# Patient Record
Sex: Female | Born: 1995 | Hispanic: No | Marital: Single | Smoking: Never smoker
Health system: Southern US, Community
[De-identification: ages and names within clinical notes are randomized; demographics above are authoritative.]

---
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
## 2017-07-01 ENCOUNTER — Ambulatory Visit: Payer: Self-pay | Admitting: Physician Assistant

## 2017-08-06 ENCOUNTER — Emergency Department (HOSPITAL_COMMUNITY): Payer: Medicaid Other

## 2017-08-06 ENCOUNTER — Encounter (HOSPITAL_COMMUNITY): Payer: Self-pay | Admitting: *Deleted

## 2017-08-06 ENCOUNTER — Emergency Department (HOSPITAL_COMMUNITY)
Admission: EM | Admit: 2017-08-06 | Discharge: 2017-08-07 | Disposition: A | Discharge status: Home / Self Care | Payer: Medicaid Other | Attending: Emergency Medicine | Admitting: Emergency Medicine

## 2017-08-06 DIAGNOSIS — N3000 Acute cystitis without hematuria: Secondary | ICD-10-CM | POA: Diagnosis not present

## 2017-08-06 DIAGNOSIS — N760 Acute vaginitis: Secondary | ICD-10-CM | POA: Insufficient documentation

## 2017-08-06 DIAGNOSIS — R1031 Right lower quadrant pain: Secondary | ICD-10-CM

## 2017-08-06 DIAGNOSIS — R102 Pelvic and perineal pain: Secondary | ICD-10-CM

## 2017-08-06 DIAGNOSIS — B9689 Other specified bacterial agents as the cause of diseases classified elsewhere: Secondary | ICD-10-CM

## 2017-08-06 LAB — CBC
HCT: 34.2 % — ABNORMAL LOW (ref 36.0–46.0)
HEMOGLOBIN: 11.9 g/dL — AB (ref 12.0–15.0)
MCH: 31.1 pg (ref 26.0–34.0)
MCHC: 34.8 g/dL (ref 30.0–36.0)
MCV: 89.3 fL (ref 78.0–100.0)
PLATELETS: 307 10*3/uL (ref 150–400)
RBC: 3.83 MIL/uL — ABNORMAL LOW (ref 3.87–5.11)
RDW: 12.2 % (ref 11.5–15.5)
WBC: 9.9 10*3/uL (ref 4.0–10.5)

## 2017-08-06 LAB — WET PREP, GENITAL
SPERM: NONE SEEN
Trich, Wet Prep: NONE SEEN
Yeast Wet Prep HPF POC: NONE SEEN

## 2017-08-06 LAB — COMPREHENSIVE METABOLIC PANEL
ALBUMIN: 4.4 g/dL (ref 3.5–5.0)
ALT: 11 U/L — ABNORMAL LOW (ref 14–54)
ANION GAP: 8 (ref 5–15)
AST: 20 U/L (ref 15–41)
Alkaline Phosphatase: 47 U/L (ref 38–126)
BILIRUBIN TOTAL: 0.4 mg/dL (ref 0.3–1.2)
BUN: 9 mg/dL (ref 6–20)
CO2: 24 mmol/L (ref 22–32)
Calcium: 9 mg/dL (ref 8.9–10.3)
Chloride: 105 mmol/L (ref 101–111)
Creatinine, Ser: 0.85 mg/dL (ref 0.44–1.00)
GFR calc Af Amer: >60 mL/min (ref >60)
GFR calc non Af Amer: >60 mL/min (ref >60)
Glucose, Bld: 98 mg/dL (ref 65–99)
Potassium: 3.6 mmol/L (ref 3.5–5.1)
Sodium: 137 mmol/L (ref 135–145)
TOTAL PROTEIN: 7 g/dL (ref 6.5–8.1)

## 2017-08-06 LAB — URINALYSIS, ROUTINE W REFLEX MICROSCOPIC
BILIRUBIN URINE: NEGATIVE
Bacteria, UA: NONE SEEN
Glucose, UA: NEGATIVE mg/dL
Ketones, ur: NEGATIVE mg/dL
NITRITE: NEGATIVE
PH: 7 (ref 5.0–8.0)
Protein, ur: NEGATIVE mg/dL
SPECIFIC GRAVITY, URINE: 1.008 (ref 1.005–1.030)

## 2017-08-06 LAB — LIPASE, BLOOD: Lipase: 38 U/L (ref 11–51)

## 2017-08-06 LAB — I-STAT BETA HCG BLOOD, ED (MC, WL, AP ONLY)

## 2017-08-06 MED ORDER — MORPHINE SULFATE (PF) 4 MG/ML IV SOLN
4.0000 mg | Freq: Once | INTRAVENOUS | Status: AC
  Administered 2017-08-06: 4 mg via INTRAVENOUS
  Filled 2017-08-06: qty 1

## 2017-08-06 MED ORDER — ONDANSETRON HCL 4 MG/2ML IJ SOLN
4.0000 mg | Freq: Once | INTRAMUSCULAR | Status: AC
  Administered 2017-08-06: 4 mg via INTRAVENOUS
  Filled 2017-08-06: qty 2

## 2017-08-06 MED ORDER — SODIUM CHLORIDE 0.9 % IV BOLUS (SEPSIS)
1000.0000 mL | Freq: Once | INTRAVENOUS | Status: AC
  Administered 2017-08-06: 1000 mL via INTRAVENOUS

## 2017-08-06 MED ORDER — IOPAMIDOL (ISOVUE-300) INJECTION 61%
INTRAVENOUS | Status: AC
  Filled 2017-08-06: qty 30

## 2017-08-06 NOTE — ED Triage Notes (Signed)
Pt c/o RLQ abd pain onset x 4 days, pt denies n/v, pt reports  Constipation with symptom onset & then x 2 loose stools today, pt denies urinary syptoms, A&O x4

## 2017-08-06 NOTE — ED Triage Notes (Signed)
Pt c/o R lower quadrant pain onset yesterday, pt denies n/v/d, pt reports constipation x 3-4 days ago with loose stools twice today, pt denies n/v/d

## 2017-08-06 NOTE — ED Provider Notes (Signed)
[facility]-EMERGENCY DEPT Provider Note   CSN: [id number] Arrival date & time: 08/06/17  1616     History   Chief Complaint Chief Complaint  Patient presents with  . Abdominal Pain    HPI   Blood pressure 110/65, pulse 73, temperature 98.1 F (36.7 C), temperature source Oral, resp. rate 16, height 5\' 2"  (1.575 m), weight 52.2 kg (115 lb), last menstrual period 08/03/2017, SpO2 100 %.  Michele Irwin is a 21 y.o. female complaining of vague abdominal pain onset 4 days ago localizing to the right lower quadrant with associated anorexia, single episode of nonbloody, nonbilious, non-coffee-ground emesis 2 days ago. She denies fevers, chills, dysuria, hematuria, abnormal vaginal discharge, history of ovarian cysts. She does follow regularly with OB/GYN. She just completed her period 2 days ago however, this is atypical for her menstrual pain and cramping.She states she is not worried about sexual transmitted infections.  History reviewed. No pertinent past medical history.  There are no active problems to display for this patient.   History reviewed. No pertinent surgical history.  OB History    No data available       Home Medications    Prior to Admission medications   Medication Sig Start Date End Date Taking? Authorizing Provider  cephALEXin (KEFLEX) 500 MG capsule Take 1 capsule (500 mg total) by mouth 2 (two) times daily. 08/07/17   Victoria Henshaw, Joni Reining, PA-C  metroNIDAZOLE (FLAGYL) 500 MG tablet Take 1 tablet (500 mg total) by mouth 3 (three) times daily. 08/07/17 08/14/17  Marijayne Rauth, Mardella Layman    Family History No family history on file.  Social History Social History  Substance Use Topics  . Smoking status: Never Smoker  . Smokeless tobacco: Never Used  . Alcohol use No     Allergies   Codeine   Review of Systems Review of Systems  A complete review of systems was obtained and all systems are negative except as noted in the HPI and PMH.   Physical  Exam Updated Vital Signs BP 116/66 (BP Location: Right Arm)   Pulse 62   Temp 98.1 F (36.7 C) (Oral)   Resp 14   Ht 5\' 2"  (1.575 m)   Wt 52.2 kg (115 lb)   LMP 08/03/2017 (Approximate)   SpO2 99%   BMI 21.03 kg/m   Physical Exam  Constitutional: She is oriented to person, place, and time. She appears well-developed and well-nourished. No distress.  HENT:  Head: Normocephalic and atraumatic.  Mouth/Throat: Oropharynx is clear and moist.  Eyes: Pupils are equal, round, and reactive to light. Conjunctivae and EOM are normal.  Neck: Normal range of motion.  Cardiovascular: Normal rate, regular rhythm and intact distal pulses.   Pulmonary/Chest: Effort normal and breath sounds normal.  Abdominal: Soft. There is no tenderness.  Hypoactive bowel sounds, tenderness in the right lower quadrant, Rovsing is negative, so as negative.   Genitourinary:  Genitourinary Comments: Pelvic exam a chaperoned by technician: No rashes or lesions, there is a profuse opaque, brownish vaginal discharge with no cervical motion or adnexal tenderness.  No CVA tenderness to percussion bilaterally.  Musculoskeletal: Normal range of motion.  Neurological: She is alert and oriented to person, place, and time.  Skin: She is not diaphoretic.  Psychiatric: She has a normal mood and affect.  Nursing note and vitals reviewed.    ED Treatments / Results  Labs (all labs ordered are listed, but only abnormal results are displayed) Labs Reviewed  WET PREP, GENITAL - Abnormal; Notable  for the following:       Result Value   Clue Cells Wet Prep HPF POC PRESENT (*)    WBC, Wet Prep HPF POC MANY (*)    All other components within normal limits  COMPREHENSIVE METABOLIC PANEL - Abnormal; Notable for the following:    ALT 11 (*)    All other components within normal limits  CBC - Abnormal; Notable for the following:    RBC 3.83 (*)    Hemoglobin 11.9 (*)    HCT 34.2 (*)    All other components within normal  limits  URINALYSIS, ROUTINE W REFLEX MICROSCOPIC - Abnormal; Notable for the following:    Color, Urine STRAW (*)    Hgb urine dipstick SMALL (*)    Leukocytes, UA LARGE (*)    Squamous Epithelial / LPF 0-5 (*)    All other components within normal limits  URINE CULTURE  LIPASE, BLOOD  RPR  HIV ANTIBODY (ROUTINE TESTING)  I-STAT BETA HCG BLOOD, ED ([facility], [facility], [facility] ONLY)  GC/CHLAMYDIA PROBE AMP ([place]) NOT [facility]    EKG  EKG Interpretation None       Radiology [place] Pelvis Transvanginal Non-ob (tv Only)  Result Date: 08/07/2017 CLINICAL DATA:  Right lower quadrant pain for 4 days EXAM: TRANSABDOMINAL AND TRANSVAGINAL ULTRASOUND OF PELVIS DOPPLER ULTRASOUND OF OVARIES TECHNIQUE: Both transabdominal and transvaginal ultrasound examinations of the pelvis were performed. Transabdominal technique was performed for global imaging of the pelvis including uterus, ovaries, adnexal regions, and pelvic cul-de-sac. It was necessary to proceed with endovaginal exam following the transabdominal exam to visualize the ovaries. Color and duplex Doppler ultrasound was utilized to evaluate blood flow to the ovaries. COMPARISON:  None. FINDINGS: Uterus Measurements: 5.7 x 2.9 x 3.8 cm. No fibroids or other mass visualized. Endometrium Thickness: 3.5 mm.  No focal abnormality visualized. Right ovary Measurements: 2.9 x 1.9 x 2.0 cm. Normal appearance/no adnexal mass. Left ovary Measurements: 3.8 x 1.8 x 2.2 cm. Normal appearance/no adnexal mass. Pulsed Doppler evaluation of both ovaries demonstrates normal low-resistance arterial and venous waveforms. Other findings No abnormal free fluid. IMPRESSION: Normal uterus and ovaries. Intact ovarian perfusion on Doppler. No abnormal pelvic fluid collections. Electronically Signed   By: Ellery Plunkaniel R Mitchell M.D.   On: 08/07/2017 00:31   [place] Pelvis (transabdominal Only)  Result Date: 08/07/2017 CLINICAL DATA:  Right lower quadrant pain for 4 days EXAM: TRANSABDOMINAL AND  TRANSVAGINAL ULTRASOUND OF PELVIS DOPPLER ULTRASOUND OF OVARIES TECHNIQUE: Both transabdominal and transvaginal ultrasound examinations of the pelvis were performed. Transabdominal technique was performed for global imaging of the pelvis including uterus, ovaries, adnexal regions, and pelvic cul-de-sac. It was necessary to proceed with endovaginal exam following the transabdominal exam to visualize the ovaries. Color and duplex Doppler ultrasound was utilized to evaluate blood flow to the ovaries. COMPARISON:  None. FINDINGS: Uterus Measurements: 5.7 x 2.9 x 3.8 cm. No fibroids or other mass visualized. Endometrium Thickness: 3.5 mm.  No focal abnormality visualized. Right ovary Measurements: 2.9 x 1.9 x 2.0 cm. Normal appearance/no adnexal mass. Left ovary Measurements: 3.8 x 1.8 x 2.2 cm. Normal appearance/no adnexal mass. Pulsed Doppler evaluation of both ovaries demonstrates normal low-resistance arterial and venous waveforms. Other findings No abnormal free fluid. IMPRESSION: Normal uterus and ovaries. Intact ovarian perfusion on Doppler. No abnormal pelvic fluid collections. Electronically Signed   By: Ellery Plunkaniel R Mitchell M.D.   On: 08/07/2017 00:31   Ct Abdomen Pelvis W Contrast  Result Date: 08/07/2017 CLINICAL DATA:  Right  lower quadrant pain for 4 days EXAM: CT ABDOMEN AND PELVIS WITH CONTRAST TECHNIQUE: Multidetector CT imaging of the abdomen and pelvis was performed using the standard protocol following bolus administration of intravenous contrast. CONTRAST:  ISOVUE-300 IOPAMIDOL (ISOVUE-300) INJECTION 61% COMPARISON:  01/10/2013 FINDINGS: Lower chest: No acute abnormality. Hepatobiliary: Probable focal fat infiltration near the falciform ligament. No gallstones, gallbladder wall thickening, or biliary dilatation. Pancreas: Unremarkable. No pancreatic ductal dilatation or surrounding inflammatory changes. Spleen: Normal in size without focal abnormality. Adrenals/Urinary Tract: Adrenal glands are  within normal limits. No hydronephrosis. 3.2 cm cyst in the mid to upper right kidney. Bladder normal Stomach/Bowel: Stomach is within normal limits. Appendix appears normal. No evidence of bowel wall thickening, distention, or inflammatory changes. Vascular/Lymphatic: No significant vascular findings are present. No enlarged abdominal or pelvic lymph nodes. Reproductive: Uterus and bilateral adnexa are unremarkable. Other: No abdominal wall hernia or abnormality. No abdominopelvic ascites. Musculoskeletal: No acute or significant osseous findings. IMPRESSION: No CT evidence for acute intra-abdominal or pelvic pathology. Negative for acute appendicitis. Electronically Signed   By: Jasmine Pang M.D.   On: 08/07/2017 02:52   US Pelvic Doppler (torsion R/o Or Mass Arterial Flow)  Result Date: 08/07/2017 CLINICAL DATA:  Right lower quadrant pain for 4 days EXAM: TRANSABDOMINAL AND TRANSVAGINAL ULTRASOUND OF PELVIS DOPPLER ULTRASOUND OF OVARIES TECHNIQUE: Both transabdominal and transvaginal ultrasound examinations of the pelvis were performed. Transabdominal technique was performed for global imaging of the pelvis including uterus, ovaries, adnexal regions, and pelvic cul-de-sac. It was necessary to proceed with endovaginal exam following the transabdominal exam to visualize the ovaries. Color and duplex Doppler ultrasound was utilized to evaluate blood flow to the ovaries. COMPARISON:  None. FINDINGS: Uterus Measurements: 5.7 x 2.9 x 3.8 cm. No fibroids or other mass visualized. Endometrium Thickness: 3.5 mm.  No focal abnormality visualized. Right ovary Measurements: 2.9 x 1.9 x 2.0 cm. Normal appearance/no adnexal mass. Left ovary Measurements: 3.8 x 1.8 x 2.2 cm. Normal appearance/no adnexal mass. Pulsed Doppler evaluation of both ovaries demonstrates normal low-resistance arterial and venous waveforms. Other findings No abnormal free fluid. IMPRESSION: Normal uterus and ovaries. Intact ovarian perfusion on  Doppler. No abnormal pelvic fluid collections. Electronically Signed   By: Ellery Plunk M.D.   On: 08/07/2017 00:31    Procedures Procedures (including critical care time)  Medications Ordered in ED Medications  iopamidol (ISOVUE-300) 61 % injection (not administered)  cephALEXin (KEFLEX) capsule 500 mg (not administered)  metroNIDAZOLE (FLAGYL) tablet 500 mg (not administered)  sodium chloride 0.9 % bolus 1,000 mL (0 mLs Intravenous Stopped 08/07/17 0047)  morphine 4 MG/ML injection 4 mg (4 mg Intravenous Given 08/06/17 2335)  ondansetron (ZOFRAN) injection 4 mg (4 mg Intravenous Given 08/06/17 2335)  iopamidol (ISOVUE-300) 61 % injection (100 mLs  Contrast Given 08/07/17 0212)     Initial Impression / Assessment and Plan / ED Course  I have reviewed the triage vital signs and the nursing notes.  Pertinent labs & imaging results that were available during my care of the patient were reviewed by me and considered in my medical decision making (see chart for details).     Vitals:   08/07/17 0200 08/07/17 0230 08/07/17 0300 08/07/17 0305  BP: 117/70 112/65 (!) 102/58 116/66  Pulse: 75 76 65 62  Resp:    14  Temp:      TempSrc:      SpO2: 100% 99% 98% 99%  Weight:      Height:  Medications  iopamidol (ISOVUE-300) 61 % injection (not administered)  cephALEXin (KEFLEX) capsule 500 mg (not administered)  metroNIDAZOLE (FLAGYL) tablet 500 mg (not administered)  sodium chloride 0.9 % bolus 1,000 mL (0 mLs Intravenous Stopped 08/07/17 0047)  morphine 4 MG/ML injection 4 mg (4 mg Intravenous Given 08/06/17 2335)  ondansetron (ZOFRAN) injection 4 mg (4 mg Intravenous Given 08/06/17 2335)  iopamidol (ISOVUE-300) 61 % injection (100 mLs  Contrast Given 08/07/17 0212)    Michele Irwin is 21 y.o. female presenting with Right lower quadrant pain worsening and localizing over the course of 4 days. She was single episode of emesis. Reduced by mouth intake. Patient has not had anything  to eat or drink today. Pelvic exam a large unremarkable, no concern for PID. CT and ultrasound pending.  Urinalysis with large leukocytes, too numerous to count whites however she has no urinary symptoms.  Ultrasound negative for torsion. CT negative for appendicitis. Will treat for urinary tract infection and bacterial vaginosis. Repeat abdominal exam remains nonsurgical. Patient will be given referral to [facility] in urgent care with had an extensive discussion of return precautions and patient verbalizes understanding and teach back technique.  Evaluation does not show pathology that would require ongoing emergent intervention or inpatient treatment. Pt is hemodynamically stable and mentating appropriately. Discussed findings and plan with patient/guardian, who agrees with care plan. All questions answered. Return precautions discussed and outpatient follow up given.    Final Clinical Impressions(s) / ED Diagnoses   Final diagnoses:  RLQ abdominal pain  Right lower quadrant abdominal pain  Acute cystitis without hematuria  Bacterial vaginosis    New Prescriptions New Prescriptions   CEPHALEXIN (KEFLEX) 500 MG CAPSULE    Take 1 capsule (500 mg total) by mouth 2 (two) times daily.   METRONIDAZOLE (FLAGYL) 500 MG TABLET    Take 1 tablet (500 mg total) by mouth 3 (three) times daily.     Michele Irwin 08/07/17 1914    Nira Conn, MD 08/07/17 [phone]

## 2017-08-07 ENCOUNTER — Emergency Department (HOSPITAL_COMMUNITY): Payer: Medicaid Other

## 2017-08-07 LAB — RPR: RPR Ser Ql: NONREACTIVE

## 2017-08-07 LAB — HIV ANTIBODY (ROUTINE TESTING W REFLEX): HIV Screen 4th Generation wRfx: NONREACTIVE

## 2017-08-07 MED ORDER — METRONIDAZOLE 500 MG PO TABS: 500.0000 mg | ORAL_TABLET | Freq: Three times a day (TID) | ORAL | 0 refills | Status: AC

## 2017-08-07 MED ORDER — CEPHALEXIN 250 MG PO CAPS
500.0000 mg | ORAL_CAPSULE | Freq: Once | ORAL | Status: AC
Start: 2017-08-07 — End: 2017-08-07
  Administered 2017-08-07: 500 mg via ORAL
  Filled 2017-08-07: qty 2

## 2017-08-07 MED ORDER — METRONIDAZOLE 500 MG PO TABS
500.0000 mg | ORAL_TABLET | Freq: Once | ORAL | Status: AC
  Administered 2017-08-07: 500 mg via ORAL
  Filled 2017-08-07: qty 1

## 2017-08-07 MED ORDER — IOPAMIDOL (ISOVUE-300) INJECTION 61%
INTRAVENOUS | Status: AC
  Administered 2017-08-07: 100 mL
  Filled 2017-08-07: qty 100

## 2017-08-07 MED ORDER — CEPHALEXIN 500 MG PO CAPS: 500.0000 mg | ORAL_CAPSULE | Freq: Two times a day (BID) | ORAL | 0 refills | Status: DC

## 2017-08-07 NOTE — ED Notes (Signed)
Patient transported to CT scan . 

## 2017-08-07 NOTE — ED Notes (Signed)
Patient back from x-ray 

## 2017-08-07 NOTE — Discharge Instructions (Signed)
For pain control you may take:  800mg of ibuprofen (that is usually 4 over the counter pills)  3 times a day (take with food) and acetaminophen 975mg (this is 3 over the counter pills) four times a day. Do not drink alcohol or combine with other medications that have acetaminophen as an ingredient (Read the labels!).  For breakthrough pain you may take Tramadol. Do not drink alcohol drive or operate heavy machinery when taking Tramadol. ° °Please follow with your primary care doctor in the next 2 days for a check-up. They must obtain records for further management.  ° °Do not hesitate to return to the Emergency Department for any new, worsening or concerning symptoms.  ° °

## 2017-08-07 NOTE — ED Notes (Signed)
Patient returned from CT

## 2017-08-07 NOTE — ED Notes (Signed)
Pt. completed drinking oral contrast. Ambulated to toilet to urinate .

## 2017-08-09 LAB — URINE CULTURE

## 2017-08-10 ENCOUNTER — Telehealth: Payer: Self-pay | Admitting: Emergency Medicine

## 2017-08-10 NOTE — Telephone Encounter (Signed)
Post ED Visit - Positive Culture Follow-up  Culture report reviewed by antimicrobial stewardship pharmacist:  []  Enzo BiNathan Batchelder, Pharm.D. []  Celedonio MiyamotoJeremy Frens, Pharm.D., BCPS AQ-ID []  Garvin FilaMike Maccia, Pharm.D., BCPS []  Georgina PillionElizabeth Martin, Pharm.D., BCPS []  McGrewMinh Pham, VermontPharm.D., BCPS, AAHIVP []  Estella HuskMichelle Turner, Pharm.D., BCPS, AAHIVP []  Lysle Pearlachel Rumbarger, PharmD, BCPS []  Casilda Carlsaylor Stone, PharmD, BCPS [x]  Pollyann SamplesAndy Johnston, PharmD, BCPS   Positive urine culture Treated with cephalexin and metronidazole, organism sensitive to the same and no further patient follow-up is required at this time.  Berle MullMiller, Demba Nigh 08/10/2017, 1:09 PM

## 2017-08-12 LAB — GC/CHLAMYDIA PROBE AMP (~~LOC~~) NOT AT ARMC
Chlamydia: NEGATIVE
NEISSERIA GONORRHEA: NEGATIVE

## 2020-02-11 ENCOUNTER — Emergency Department (HOSPITAL_COMMUNITY)
Admission: EM | Admit: 2020-02-11 | Discharge: 2020-02-11 | Disposition: A | Discharge status: Home / Self Care | Payer: 59 | Attending: Emergency Medicine | Admitting: Emergency Medicine

## 2020-02-11 ENCOUNTER — Other Ambulatory Visit: Payer: Self-pay

## 2020-02-11 ENCOUNTER — Encounter (HOSPITAL_COMMUNITY): Payer: Self-pay

## 2020-02-11 DIAGNOSIS — R319 Hematuria, unspecified: Secondary | ICD-10-CM | POA: Diagnosis present

## 2020-02-11 DIAGNOSIS — N3001 Acute cystitis with hematuria: Secondary | ICD-10-CM | POA: Insufficient documentation

## 2020-02-11 LAB — URINALYSIS, ROUTINE W REFLEX MICROSCOPIC
Bilirubin Urine: NEGATIVE
Glucose, UA: NEGATIVE mg/dL
Ketones, ur: NEGATIVE mg/dL
Nitrite: NEGATIVE
Protein, ur: 100 mg/dL — AB
Specific Gravity, Urine: 1.002 — ABNORMAL LOW (ref 1.005–1.030)
WBC, UA: >50 WBC/hpf — ABNORMAL HIGH (ref 0–5)
pH: 7 (ref 5.0–8.0)

## 2020-02-11 LAB — POC URINE PREG, ED: Preg Test, Ur: NEGATIVE

## 2020-02-11 MED ORDER — CEPHALEXIN 500 MG PO CAPS: 500.0000 mg | ORAL_CAPSULE | Freq: Two times a day (BID) | ORAL | 0 refills | Status: AC

## 2020-02-11 NOTE — ED Provider Notes (Signed)
[facility] DEPT Provider Note   CSN: [id number] Arrival date & time: 02/11/20  0019     History Chief Complaint  Patient presents with  . Hematuria    Michele Irwin is a 24 y.o. female.  24 year old female presents to the emergency department for evaluation of hematuria.  She states that she had an episode of mild dysuria around 2300 tonight.  Voided 45 minutes later and noticed blood in her urine.  She has not had any passage of clots.  Expresses concern for UTI.  She does report some back pain a few days ago, but denies any at present.  No fevers, nausea, vomiting.  No medications taken prior to arrival.  The history is provided by the patient. No language interpreter was used.  Hematuria       History reviewed. No pertinent past medical history.  There are no problems to display for this patient.   History reviewed. No pertinent surgical history.   OB History   No obstetric history on file.     History reviewed. No pertinent family history.  Social History   Tobacco Use  . Smoking status: Never Smoker  . Smokeless tobacco: Never Used  Substance Use Topics  . Alcohol use: No  . Drug use: No    Home Medications Prior to Admission medications   Medication Sig Start Date End Date Taking? Authorizing Provider  cephALEXin (KEFLEX) 500 MG capsule Take 1 capsule (500 mg total) by mouth 2 (two) times daily for 7 days. 02/11/20 02/18/20  Antonietta Breach, PA-C    Allergies    Codeine  Review of Systems   Review of Systems  Genitourinary: Positive for hematuria.  Ten systems reviewed and are negative for acute change, except as noted in the HPI.    Physical Exam Updated Vital Signs BP (!) 151/96 (BP Location: Right Arm)   Pulse (!) 112   Temp 98.7 F (37.1 C) (Oral)   Resp 16   Ht 5\' 2"  (1.575 m)   Wt 49.9 kg   SpO2 99%   BMI 20.12 kg/m   Physical Exam Vitals and nursing note reviewed.  Constitutional:      General: She  is not in acute distress.    Appearance: She is well-developed. She is not diaphoretic.     Comments: Nontoxic appearing and in NAD  HENT:     Head: Normocephalic and atraumatic.  Eyes:     General: No scleral icterus.    Conjunctiva/sclera: Conjunctivae normal.  Pulmonary:     Effort: Pulmonary effort is normal. No respiratory distress.     Comments: Respirations even and unlabored Abdominal:     General: There is no distension.  Musculoskeletal:        General: Normal range of motion.     Cervical back: Normal range of motion.  Skin:    General: Skin is warm and dry.     Coloration: Skin is not pale.     Findings: No erythema or rash.  Neurological:     Mental Status: She is alert and oriented to person, place, and time.     Coordination: Coordination normal.  Psychiatric:        Behavior: Behavior normal.     ED Results / Procedures / Treatments   Labs (all labs ordered are listed, but only abnormal results are displayed) Labs Reviewed  URINALYSIS, ROUTINE W REFLEX MICROSCOPIC - Abnormal; Notable for the following components:      Result Value  Color, Urine RED (*)    APPearance HAZY (*)    Specific Gravity, Urine 1.002 (*)    Hgb urine dipstick LARGE (*)    Protein, ur 100 (*)    Leukocytes,Ua LARGE (*)    WBC, UA >50 (*)    Bacteria, UA FEW (*)    All other components within normal limits  POC URINE PREG, ED    EKG None  Radiology No results found.  Procedures Procedures (including critical care time)  Medications Ordered in ED Medications - No data to display  ED Course  I have reviewed the triage vital signs and the nursing notes.  Pertinent labs & imaging results that were available during my care of the patient were reviewed by me and considered in my medical decision making (see chart for details).  Clinical Course as of Feb 11 124  Wynelle Link Feb 11, 2020  0120 Lab called to confirm microscopic results given documented urine color with large  hemoglobin urine dipstick.  Urine was, again, reviewed under the microscope and verbally reported to have 0-5 RBCs per lab with numerous WBCs.  Plan for treatment with antibiotics for hemorrhagic cystitis.   [KH]    Clinical Course User Index [KH] Antony Madura, PA-C   MDM Rules/Calculators/A&P                      Patient has been diagnosed with a UTI. She is afebrile, no CVA tenderness, normotensive, and denies N/V. Patient to be discharged home with antibiotics and instructions to follow up with PCP if symptoms persist. Return precautions discussed and provided. Patient discharged in stable condition with no unaddressed concerns.   Final Clinical Impression(s) / ED Diagnoses Final diagnoses:  Acute cystitis with hematuria    Rx / DC Orders ED Discharge Orders         Ordered    cephALEXin (KEFLEX) 500 MG capsule  2 times daily     02/11/20 0121           Antony Madura, PA-C 02/11/20 0127    Dione Booze, MD 02/11/20 [phone]

## 2020-02-11 NOTE — ED Triage Notes (Signed)
Pt states that she thinks that she has a UTI. Reports hematuria and discomfort with urination.

## 2020-02-11 NOTE — Discharge Instructions (Signed)
Take Keflex as prescribed until finished.  We recommend Tylenol or ibuprofen for management of any discomfort.  You may also use over-the-counter AZO tablets for urinary urgency or frequency if this develops.  Return for new or concerning symptoms.

## 2020-03-16 ENCOUNTER — Ambulatory Visit: Payer: 59 | Attending: Internal Medicine

## 2020-03-16 DIAGNOSIS — Z23 Encounter for immunization: Secondary | ICD-10-CM

## 2020-03-16 NOTE — Progress Notes (Signed)
   Covid-19 Vaccination Clinic  Name:  Sameerah Nachtigal    MRN: [id number] DOB: [birth year]  03/16/2020  Ms. Shiflett was observed post Covid-19 immunization for 15 minutes without incident. She was provided with Vaccine Information Sheet and instruction to access the V-Safe system.   Ms. Locken was instructed to call 911 with any severe reactions post vaccine: Marland Kitchen Difficulty breathing  . Swelling of face and throat  . A fast heartbeat  . A bad rash all over body  . Dizziness and weakness   Immunizations Administered    Name Date Dose VIS Date Route   Pfizer COVID-19 Vaccine 03/16/2020  3:57 PM 0.3 mL 11/17/2019 Intramuscular   Manufacturer: [employer], [employer]   Lot: [id number]   NDC: [address]-3

## 2020-04-08 ENCOUNTER — Ambulatory Visit: Payer: 59

## 2022-02-10 ENCOUNTER — Encounter (HOSPITAL_COMMUNITY): Payer: Self-pay | Admitting: *Deleted

## 2022-02-10 ENCOUNTER — Emergency Department (HOSPITAL_COMMUNITY): Payer: No Typology Code available for payment source

## 2022-02-10 ENCOUNTER — Other Ambulatory Visit: Payer: Self-pay

## 2022-02-10 ENCOUNTER — Emergency Department (HOSPITAL_COMMUNITY)
Admission: EM | Admit: 2022-02-10 | Discharge: 2022-02-10 | Disposition: A | Discharge status: Home / Self Care | Payer: No Typology Code available for payment source | Attending: Emergency Medicine | Admitting: Emergency Medicine

## 2022-02-10 DIAGNOSIS — Z23 Encounter for immunization: Secondary | ICD-10-CM | POA: Insufficient documentation

## 2022-02-10 DIAGNOSIS — S61431A Puncture wound without foreign body of right hand, initial encounter: Secondary | ICD-10-CM | POA: Insufficient documentation

## 2022-02-10 DIAGNOSIS — Z2914 Encounter for prophylactic rabies immune globin: Secondary | ICD-10-CM | POA: Insufficient documentation

## 2022-02-10 DIAGNOSIS — W5501XA Bitten by cat, initial encounter: Secondary | ICD-10-CM | POA: Insufficient documentation

## 2022-02-10 DIAGNOSIS — Y990 Civilian activity done for income or pay: Secondary | ICD-10-CM | POA: Diagnosis not present

## 2022-02-10 DIAGNOSIS — S61451A Open bite of right hand, initial encounter: Secondary | ICD-10-CM | POA: Diagnosis present

## 2022-02-10 LAB — PREGNANCY, URINE: Preg Test, Ur: NEGATIVE

## 2022-02-10 MED ORDER — RABIES VACCINE, PCEC IM SUSR
1.0000 mL | Freq: Once | INTRAMUSCULAR | Status: AC
  Administered 2022-02-10: 1 mL via INTRAMUSCULAR
  Filled 2022-02-10: qty 1

## 2022-02-10 MED ORDER — AMOXICILLIN-POT CLAVULANATE 875-125 MG PO TABS
1.0000 | ORAL_TABLET | Freq: Once | ORAL | Status: AC
  Administered 2022-02-10: 1 via ORAL
  Filled 2022-02-10: qty 1

## 2022-02-10 MED ORDER — RABIES IMMUNE GLOBULIN 150 UNIT/ML IM INJ
20.0000 [IU]/kg | INJECTION | Freq: Once | INTRAMUSCULAR | Status: AC
  Administered 2022-02-10: 1200 [IU] via INTRAMUSCULAR
  Filled 2022-02-10: qty 8

## 2022-02-10 NOTE — Discharge Instructions (Addendum)
Take Augmentin as prescribed and complete the full course.  Follow-up with urgent care for further rabies vaccine doses on 02/13/22, 02/17/22, 02/24/22. ?

## 2022-02-10 NOTE — ED Triage Notes (Signed)
Cat bite on the right hand today at work. Went to walk in clinic prior and received tetanus and abx, sent here for rabies vaccine.  ?

## 2022-02-10 NOTE — ED Provider Notes (Signed)
?[facility]-EMERGENCY DEPT ?Provider Note ? ? ?CSN: [id number] ?Arrival date & time: 02/10/22  1919 ? ?  ? ?History ? ?Chief Complaint  ?Patient presents with  ? Animal Bite  ? ? ?Michele Irwin is a 26 y.o. female. ? ?26 year old female presents with cat bite to the right hand.  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  Wound was cleaned at that time.  Patient went to urgent care where she was given a tetanus vaccine and prescription for Augmentin.  Animal control is aware however after the bite happened the person left the facility with the cat and patient is concerned this Will not be able to be located.  No prior rabies vaccine.  No other injuries, complaints, concerns.  Patient is right-hand dominant. ? ? ?  ? ?Home Medications ?Prior to Admission medications   ?Not on File  ?   ? ?Allergies    ?Codeine   ? ?Review of Systems   ?Review of Systems ?Negative except as per HPI ?Physical Exam ?Updated Vital Signs ?BP 130/82 (BP Location: Left Arm)   Pulse 85   Temp 98.4 ?F (36.9 ?C) (Oral)   Resp 18   Ht 5\' 3"  (1.6 m)   Wt 59 kg   SpO2 100%   BMI 23.03 kg/m?  ?Physical Exam ?Vitals and nursing note reviewed.  ?Constitutional:   ?   General: She is not in acute distress. ?   Appearance: She is well-developed. She is not diaphoretic.  ?HENT:  ?   Head: Normocephalic and atraumatic.  ?Cardiovascular:  ?   Pulses: Normal pulses.  ?Pulmonary:  ?   Effort: Pulmonary effort is normal.  ?Musculoskeletal:     ?   General: Tenderness present.  ?   Comments: Single puncture wound to dorsum of right hand and webspace between the thumb and index finger.  ?Skin: ?   General: Skin is warm and dry.  ?   Findings: No erythema or rash.  ?Neurological:  ?   Mental Status: She is alert and oriented to person, place, and time.  ?   Sensory: No sensory deficit.  ?   Motor: No weakness.  ?Psychiatric:     ?    Behavior: Behavior normal.  ? ? ?ED Results / Procedures / Treatments   ?Labs ?(all labs ordered are listed, but only abnormal results are displayed) ?Labs Reviewed  ?PREGNANCY, URINE  ? ? ?EKG ?None ? ?Radiology ?DG Hand Complete Right ? ?Result Date: 02/10/2022 ?CLINICAL DATA:  Cat bite. EXAM: RIGHT HAND - COMPLETE 3+ VIEW COMPARISON:  None. FINDINGS: There is no evidence of fracture or dislocation. There is no evidence of arthropathy or other focal bone abnormality. Soft tissues are unremarkable. No evidence for foreign body. IMPRESSION: Negative. Electronically Signed   By: 04/12/2022 M.D.   On: 02/10/2022 20:37   ? ?Procedures ?Procedures  ? ? ?Medications Ordered in ED ?Medications  ?rabies immune globulin (HYPERAB/KEDRAB) injection 1,200 Units (1,200 Units Intramuscular Given 02/10/22 2218)  ?rabies vaccine (RABAVERT) injection 1 mL (1 mL Intramuscular Given 02/10/22 2216)  ?amoxicillin-clavulanate (AUGMENTIN) 875-125 MG per tablet 1 tablet (1 tablet Oral Given 02/10/22 2221)  ? ? ?ED Course/ Medical Decision Making/ A&P ?  ?                        ?Medical  Decision Making ?Amount and/or Complexity of Data Reviewed ?Labs: ordered. ?Radiology: ordered. ? ?Risk ?Prescription drug management. ? ? ?26 year old female presents for evaluation after cat bite to the right hand as above.  She is found to have a single puncture wound to the dorsum of the right hand between the thumb and index fingers in the web space.  This area was thoroughly cleaned prior to arrival.  Patient was given prescription for Augmentin as well as tetanus vaccine at urgent care.  Due to concern that the person who brought the cat to clinic may try to hide the cat or otherwise let the cat run free (feral cat), patient has decided to initiate rabies vaccine series. ?X-ray is negative for retained foreign body.  Pregnancy test is negative.  Plan is to inject vaccine around bite site with additional dose as ordered.  Patient is aware of follow-up in  urgent care for further vaccines per schedule. ? ? ? ? ? ? ? ?Final Clinical Impression(s) / ED Diagnoses ?Final diagnoses:  ?Cat bite, initial encounter  ? ? ?Rx / DC Orders ?ED Discharge Orders   ? ? None  ? ?  ? ? ?  ?Jeannie Fend, PA-C ?02/10/22 2253 ? ?  ?Derwood Kaplan, MD ?02/11/22 1622 ? ?

## 2022-02-13 ENCOUNTER — Ambulatory Visit (HOSPITAL_COMMUNITY)
Admission: EM | Admit: 2022-02-13 | Discharge: 2022-02-13 | Disposition: A | Discharge status: Home / Self Care | Payer: Worker's Compensation | Attending: Internal Medicine | Admitting: Internal Medicine

## 2022-02-13 DIAGNOSIS — Z203 Contact with and (suspected) exposure to rabies: Secondary | ICD-10-CM

## 2022-02-13 MED ORDER — RABIES VACCINE, PCEC IM SUSR
INTRAMUSCULAR | Status: AC
  Filled 2022-02-13: qty 1

## 2022-02-13 MED ORDER — RABIES VACCINE, PCEC IM SUSR
1.0000 mL | Freq: Once | INTRAMUSCULAR | Status: AC
  Administered 2022-02-13: 1 mL via INTRAMUSCULAR

## 2022-02-13 NOTE — ED Triage Notes (Signed)
Pt here for 2nd rabies shot 

## 2022-02-17 ENCOUNTER — Other Ambulatory Visit: Payer: Self-pay

## 2022-02-17 ENCOUNTER — Ambulatory Visit (HOSPITAL_COMMUNITY)
Admission: EM | Admit: 2022-02-17 | Discharge: 2022-02-17 | Disposition: A | Discharge status: Home / Self Care | Payer: No Typology Code available for payment source | Attending: Internal Medicine | Admitting: Internal Medicine

## 2022-02-17 DIAGNOSIS — Z203 Contact with and (suspected) exposure to rabies: Secondary | ICD-10-CM | POA: Diagnosis not present

## 2022-02-17 MED ORDER — RABIES VACCINE, PCEC IM SUSR
INTRAMUSCULAR | Status: AC
  Filled 2022-02-17: qty 1

## 2022-02-17 MED ORDER — RABIES VACCINE, PCEC IM SUSR
1.0000 mL | Freq: Once | INTRAMUSCULAR | Status: AC
  Administered 2022-02-17: 1 mL via INTRAMUSCULAR

## 2022-02-17 NOTE — ED Triage Notes (Signed)
Pt here for her 3 rd rabies shot.  ?

## 2022-02-24 ENCOUNTER — Ambulatory Visit (HOSPITAL_COMMUNITY)
Admission: EM | Admit: 2022-02-24 | Discharge: 2022-02-24 | Disposition: A | Discharge status: Home / Self Care | Payer: Worker's Compensation | Attending: Internal Medicine | Admitting: Internal Medicine

## 2022-02-24 DIAGNOSIS — Z203 Contact with and (suspected) exposure to rabies: Secondary | ICD-10-CM | POA: Diagnosis not present

## 2022-02-24 MED ORDER — RABIES VACCINE, PCEC IM SUSR
1.0000 mL | Freq: Once | INTRAMUSCULAR | Status: AC
  Administered 2022-02-24: 1 mL via INTRAMUSCULAR

## 2022-02-24 MED ORDER — RABIES VACCINE, PCEC IM SUSR
INTRAMUSCULAR | Status: AC
  Filled 2022-02-24: qty 1

## 2022-02-24 NOTE — ED Triage Notes (Signed)
Pt presents for 4th dose of rabies injection.  ?

## 2023-08-16 IMAGING — CR DG HAND COMPLETE 3+V*R*
3 series · 3 of 3 positions shown · non-contrast
Comparison: None.

CLINICAL DATA: Cat bite.

EXAM:
RIGHT HAND - COMPLETE 3+ VIEW

[x hand pa right]
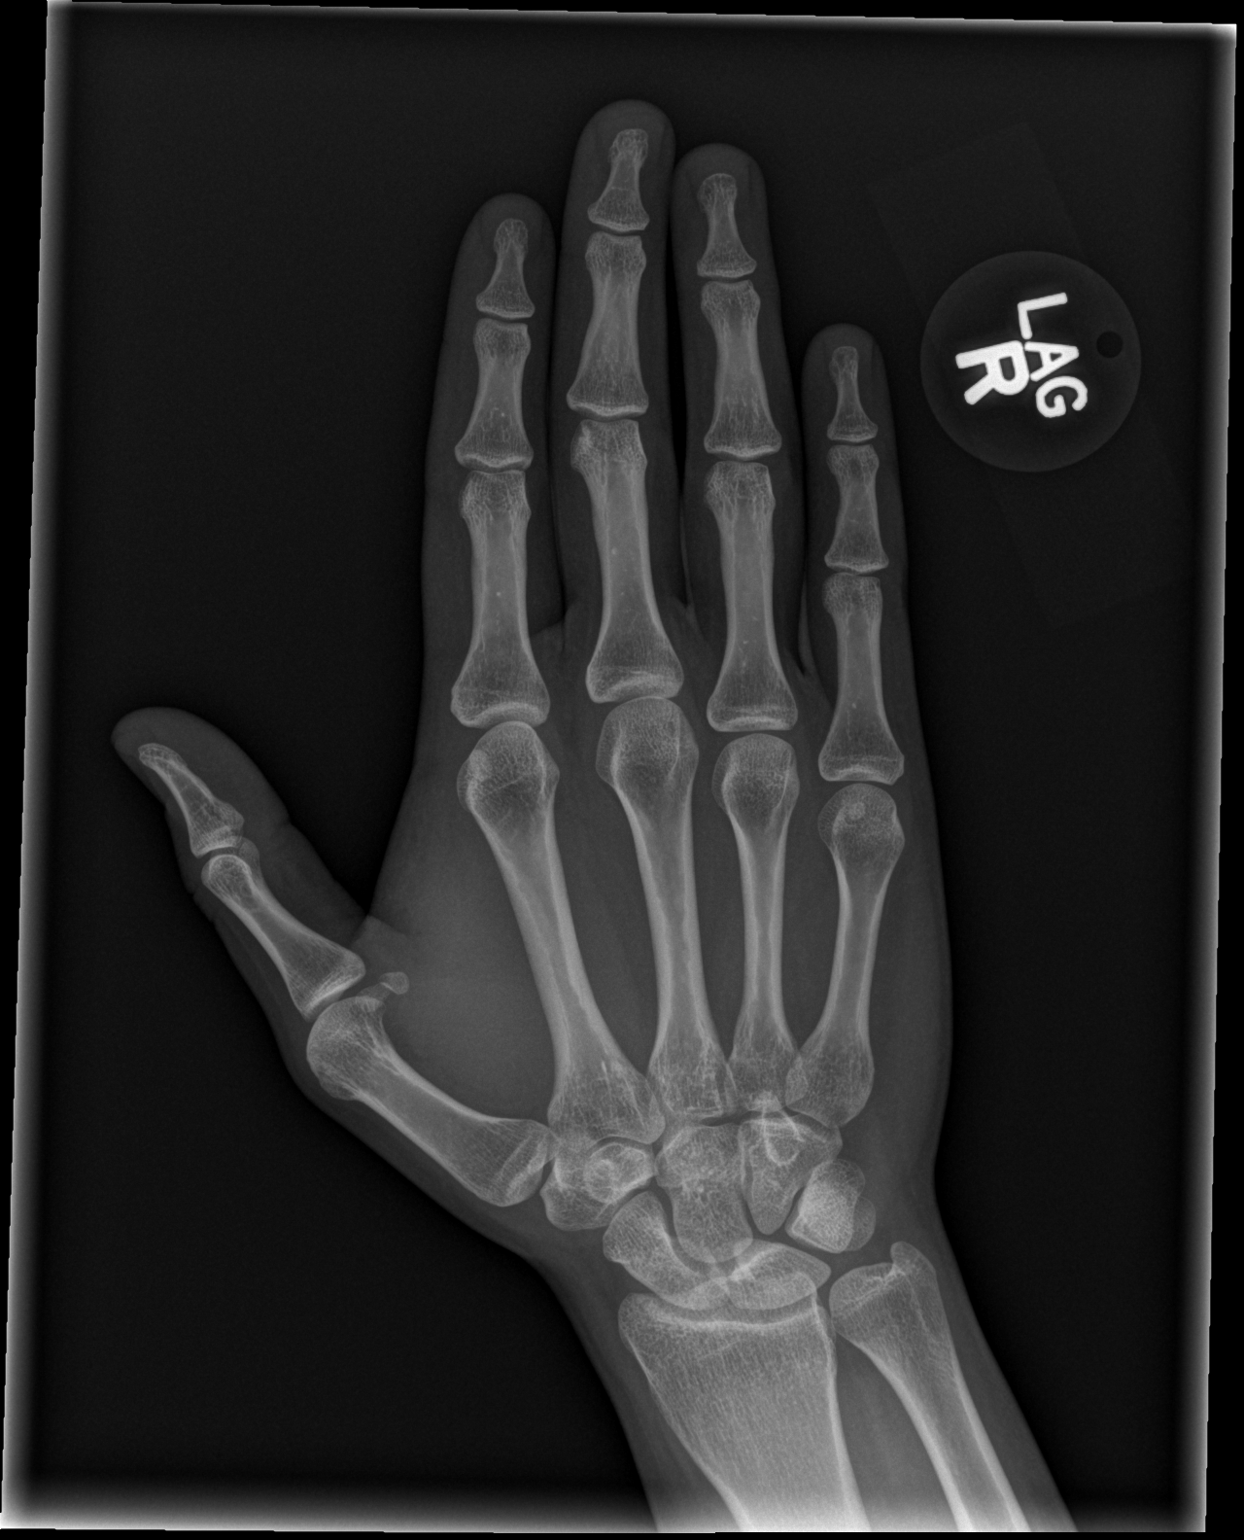

[x hand obl right]
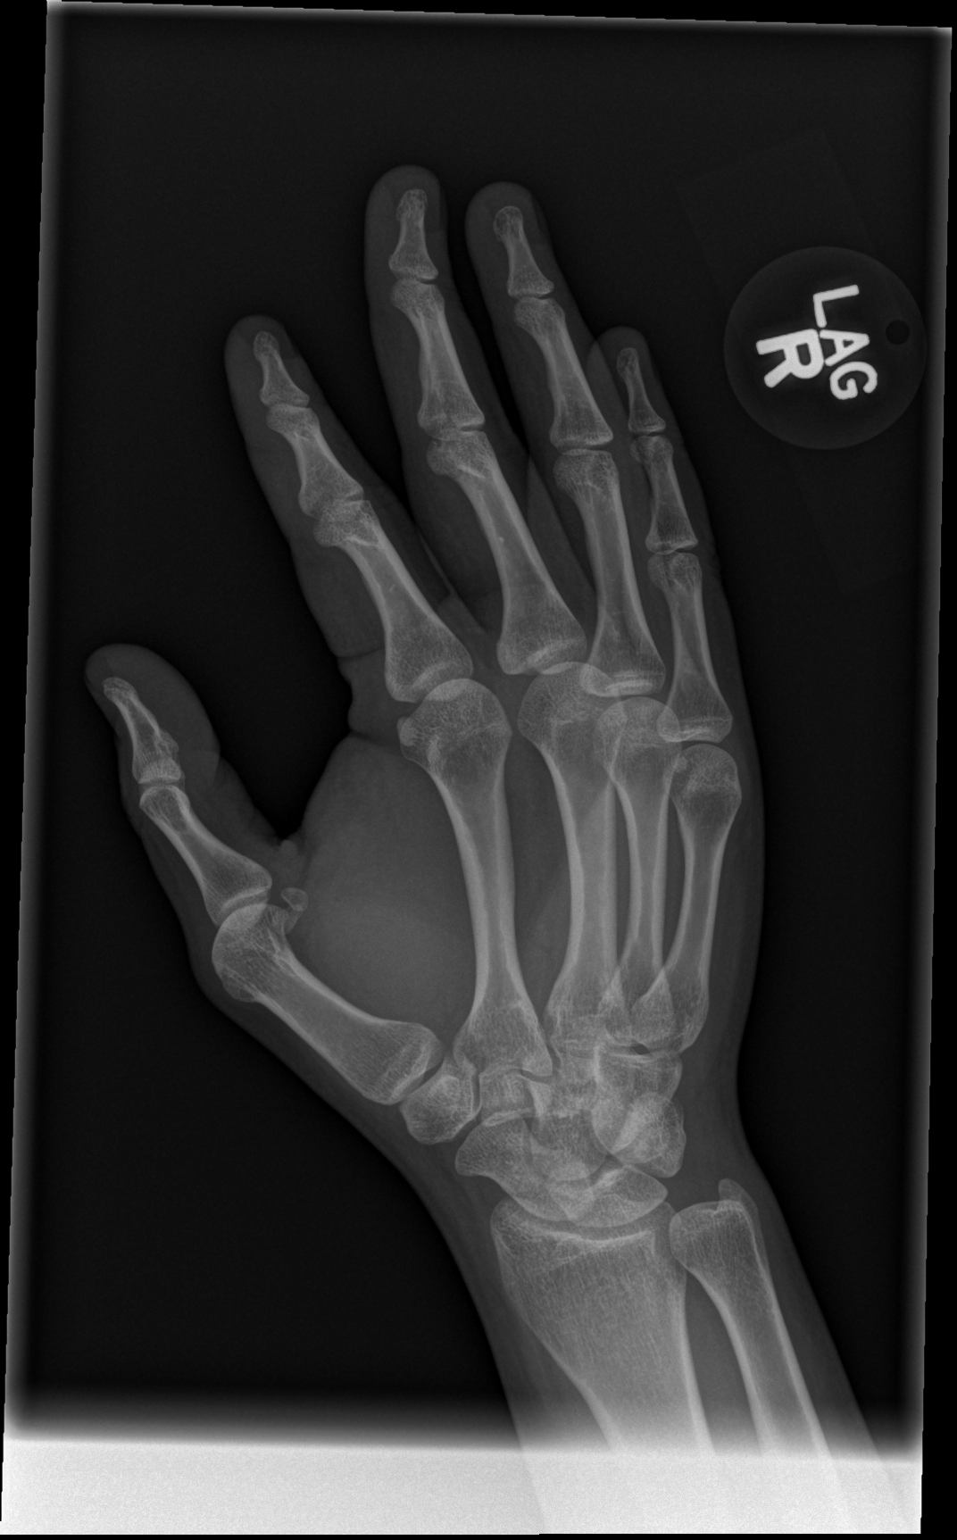

[x hand lat right]
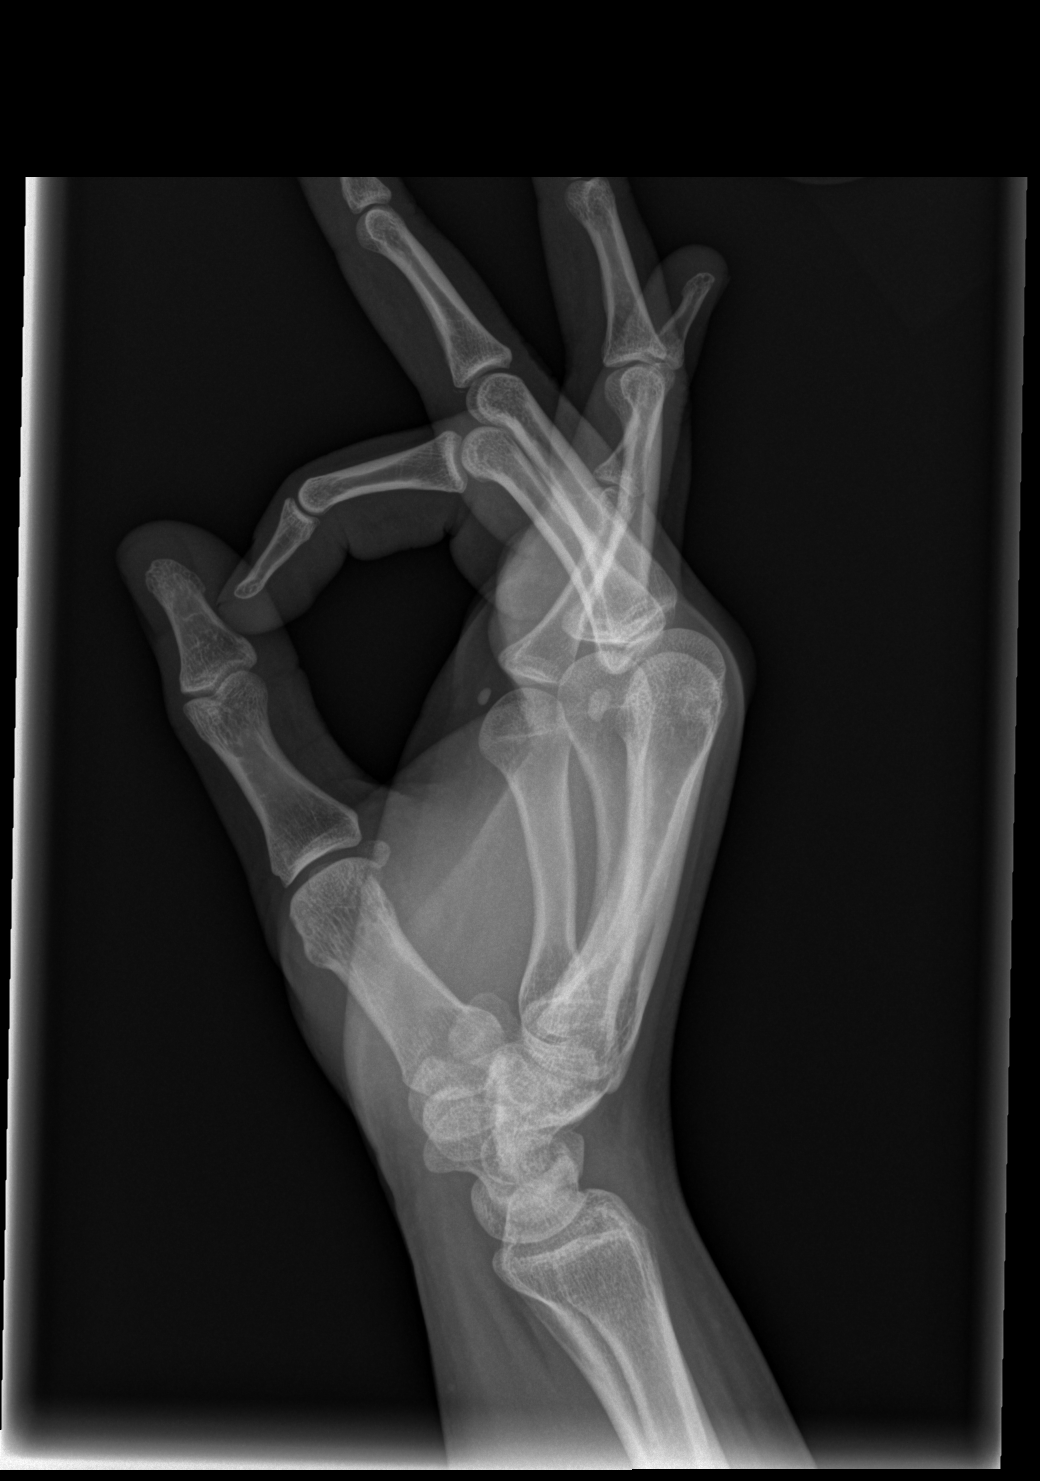

[3 of 3 positions shown; findings below may reference images not displayed]

FINDINGS: There is no evidence of fracture or dislocation. There is no
evidence of arthropathy or other focal bone abnormality. Soft
tissues are unremarkable. No evidence for foreign body.
IMPRESSION: Negative.
